# Patient Record
Sex: Male | Born: 1970 | Race: White | Hispanic: No | Marital: Married | Smoking: Never smoker
Health system: Southern US, Community
[De-identification: ages and names within clinical notes are randomized; demographics above are authoritative.]

## PROBLEM LIST (undated history)

## (undated) DIAGNOSIS — K5790 Diverticulosis of intestine, part unspecified, without perforation or abscess without bleeding: Secondary | ICD-10-CM

## (undated) DIAGNOSIS — K219 Gastro-esophageal reflux disease without esophagitis: Secondary | ICD-10-CM

## (undated) DIAGNOSIS — N200 Calculus of kidney: Secondary | ICD-10-CM

## (undated) DIAGNOSIS — F419 Anxiety disorder, unspecified: Secondary | ICD-10-CM

## (undated) HISTORY — PX: HEMORRHOID SURGERY: SHX153

## (undated) HISTORY — PX: COLON SURGERY: SHX602

---

## 2011-05-17 ENCOUNTER — Ambulatory Visit: Payer: Self-pay

## 2011-07-25 ENCOUNTER — Encounter (HOSPITAL_COMMUNITY): Payer: Self-pay | Admitting: Pharmacy Technician
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
## 2011-07-26 ENCOUNTER — Encounter (HOSPITAL_COMMUNITY): Payer: Self-pay | Admitting: *Deleted

## 2011-07-26 MED ORDER — VANCOMYCIN HCL 1000 MG IV SOLR
1500.0000 mg | INTRAVENOUS | Status: AC
  Administered 2011-07-27: 1500 mg via INTRAVENOUS
  Filled 2011-07-26: qty 1500

## 2011-07-26 MED ORDER — CHLORHEXIDINE GLUCONATE 4 % EX LIQD
60.0000 mL | Freq: Once | CUTANEOUS | Status: DC
  Filled 2011-07-26: qty 60

## 2011-07-26 MED ORDER — SODIUM CHLORIDE 0.9 % IV SOLN: INTRAVENOUS | Status: DC
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
## 2011-07-27 ENCOUNTER — Ambulatory Visit (HOSPITAL_COMMUNITY): Payer: 59 | Admitting: Anesthesiology

## 2011-07-27 ENCOUNTER — Encounter (HOSPITAL_COMMUNITY)
Admission: RE | Disposition: A | Discharge status: Home / Self Care | Payer: Self-pay | Source: Ambulatory Visit | Attending: Orthopedic Surgery

## 2011-07-27 ENCOUNTER — Ambulatory Visit (HOSPITAL_COMMUNITY): Payer: 59

## 2011-07-27 ENCOUNTER — Ambulatory Visit (HOSPITAL_COMMUNITY)
Admission: RE | Admit: 2011-07-27 | Discharge: 2011-07-28 | Disposition: A | Discharge status: Home / Self Care | Payer: 59 | Source: Ambulatory Visit | Attending: Orthopedic Surgery | Admitting: Orthopedic Surgery

## 2011-07-27 ENCOUNTER — Encounter (HOSPITAL_COMMUNITY): Payer: Self-pay | Admitting: Anesthesiology

## 2011-07-27 ENCOUNTER — Encounter (HOSPITAL_COMMUNITY): Payer: Self-pay | Admitting: *Deleted

## 2011-07-27 DIAGNOSIS — Y9389 Activity, other specified: Secondary | ICD-10-CM | POA: Insufficient documentation

## 2011-07-27 DIAGNOSIS — Y990 Civilian activity done for income or pay: Secondary | ICD-10-CM | POA: Insufficient documentation

## 2011-07-27 DIAGNOSIS — F411 Generalized anxiety disorder: Secondary | ICD-10-CM | POA: Insufficient documentation

## 2011-07-27 DIAGNOSIS — K219 Gastro-esophageal reflux disease without esophagitis: Secondary | ICD-10-CM | POA: Insufficient documentation

## 2011-07-27 DIAGNOSIS — S42009A Fracture of unspecified part of unspecified clavicle, initial encounter for closed fracture: Secondary | ICD-10-CM

## 2011-07-27 DIAGNOSIS — W1789XA Other fall from one level to another, initial encounter: Secondary | ICD-10-CM | POA: Insufficient documentation

## 2011-07-27 DIAGNOSIS — K5730 Diverticulosis of large intestine without perforation or abscess without bleeding: Secondary | ICD-10-CM | POA: Insufficient documentation

## 2011-07-27 DIAGNOSIS — S42023A Displaced fracture of shaft of unspecified clavicle, initial encounter for closed fracture: Secondary | ICD-10-CM | POA: Insufficient documentation

## 2011-07-27 DIAGNOSIS — Z87442 Personal history of urinary calculi: Secondary | ICD-10-CM | POA: Insufficient documentation

## 2011-07-27 HISTORY — PX: ORIF CLAVICULAR FRACTURE: SHX5055

## 2011-07-27 HISTORY — DX: Calculus of kidney: N20.0

## 2011-07-27 HISTORY — DX: Diverticulosis of intestine, part unspecified, without perforation or abscess without bleeding: K57.90

## 2011-07-27 HISTORY — DX: Anxiety disorder, unspecified: F41.9

## 2011-07-27 HISTORY — DX: Gastro-esophageal reflux disease without esophagitis: K21.9

## 2011-07-27 LAB — CBC
HCT: 39.5 % (ref 39.0–52.0)
MCHC: 35.7 g/dL (ref 30.0–36.0)
MCV: 85.1 fL (ref 78.0–100.0)
Platelets: 295 10*3/uL (ref 150–400)
RDW: 12.6 % (ref 11.5–15.5)

## 2011-07-27 SURGERY — OPEN REDUCTION INTERNAL FIXATION (ORIF) CLAVICULAR FRACTURE
Anesthesia: General | Site: Shoulder | Laterality: Right | Wound class: Clean

## 2011-07-27 MED ORDER — SODIUM CHLORIDE 0.9 % IV SOLN
INTRAVENOUS | Status: DC | PRN
  Administered 2011-07-27: 13:00:00 via INTRAVENOUS

## 2011-07-27 MED ORDER — ONDANSETRON HCL 4 MG/2ML IJ SOLN
INTRAMUSCULAR | Status: DC | PRN
  Administered 2011-07-27: 4 mg via INTRAVENOUS

## 2011-07-27 MED ORDER — BUPIVACAINE-EPINEPHRINE 0.25% -1:200000 IJ SOLN
INTRAMUSCULAR | Status: DC | PRN
  Administered 2011-07-27: 4 mL

## 2011-07-27 MED ORDER — ROCURONIUM BROMIDE 100 MG/10ML IV SOLN
INTRAVENOUS | Status: DC | PRN
  Administered 2011-07-27: 30 mg via INTRAVENOUS
  Administered 2011-07-27: 50 mg via INTRAVENOUS

## 2011-07-27 MED ORDER — GLYCOPYRROLATE 0.2 MG/ML IJ SOLN
INTRAMUSCULAR | Status: DC | PRN
  Administered 2011-07-27: .5 mg via INTRAVENOUS

## 2011-07-27 MED ORDER — MENTHOL 3 MG MT LOZG: 1.0000 | LOZENGE | OROMUCOSAL | Status: DC | PRN

## 2011-07-27 MED ORDER — ONDANSETRON HCL 4 MG/2ML IJ SOLN: 4.0000 mg | Freq: Four times a day (QID) | INTRAMUSCULAR | Status: DC | PRN

## 2011-07-27 MED ORDER — DEXTROSE 5 % IV SOLN
500.0000 mg | Freq: Four times a day (QID) | INTRAVENOUS | Status: DC | PRN
  Filled 2011-07-27: qty 5

## 2011-07-27 MED ORDER — 0.9 % SODIUM CHLORIDE (POUR BTL) OPTIME
TOPICAL | Status: DC | PRN
  Administered 2011-07-27: 1000 mL

## 2011-07-27 MED ORDER — DEXAMETHASONE SODIUM PHOSPHATE 4 MG/ML IJ SOLN
INTRAMUSCULAR | Status: DC | PRN
  Administered 2011-07-27 (×2): 4 mg via INTRAVENOUS

## 2011-07-27 MED ORDER — ACETAMINOPHEN 10 MG/ML IV SOLN
INTRAVENOUS | Status: AC
  Filled 2011-07-27: qty 100

## 2011-07-27 MED ORDER — ACETAMINOPHEN 650 MG RE SUPP: 650.0000 mg | Freq: Four times a day (QID) | RECTAL | Status: DC | PRN

## 2011-07-27 MED ORDER — METHOCARBAMOL 500 MG PO TABS
500.0000 mg | ORAL_TABLET | Freq: Four times a day (QID) | ORAL | Status: DC | PRN
  Administered 2011-07-27 – 2011-07-28 (×2): 500 mg via ORAL
  Filled 2011-07-27: qty 1

## 2011-07-27 MED ORDER — PANTOPRAZOLE SODIUM 40 MG PO TBEC
40.0000 mg | DELAYED_RELEASE_TABLET | Freq: Two times a day (BID) | ORAL | Status: DC
  Filled 2011-07-27 (×2): qty 1

## 2011-07-27 MED ORDER — METOCLOPRAMIDE HCL 5 MG/ML IJ SOLN: 5.0000 mg | Freq: Three times a day (TID) | INTRAMUSCULAR | Status: DC | PRN

## 2011-07-27 MED ORDER — LACTATED RINGERS IV SOLN
INTRAVENOUS | Status: DC | PRN
  Administered 2011-07-27 (×2): via INTRAVENOUS

## 2011-07-27 MED ORDER — POTASSIUM CHLORIDE IN NACL 20-0.9 MEQ/L-% IV SOLN
INTRAVENOUS | Status: DC
  Administered 2011-07-27: 18:00:00 via INTRAVENOUS
  Filled 2011-07-27 (×2): qty 1000

## 2011-07-27 MED ORDER — ESCITALOPRAM OXALATE 10 MG PO TABS
10.0000 mg | ORAL_TABLET | Freq: Every day | ORAL | Status: DC
  Administered 2011-07-28: 10 mg via ORAL
  Filled 2011-07-27: qty 1

## 2011-07-27 MED ORDER — NEOSTIGMINE METHYLSULFATE 1 MG/ML IJ SOLN
INTRAMUSCULAR | Status: DC | PRN
  Administered 2011-07-27: 4 mg via INTRAVENOUS

## 2011-07-27 MED ORDER — HYDROMORPHONE HCL PF 1 MG/ML IJ SOLN: 0.2500 mg | INTRAMUSCULAR | Status: DC | PRN

## 2011-07-27 MED ORDER — PROPOFOL 10 MG/ML IV EMUL
INTRAVENOUS | Status: DC | PRN
  Administered 2011-07-27: 180 mg via INTRAVENOUS

## 2011-07-27 MED ORDER — OXYCODONE-ACETAMINOPHEN 5-325 MG PO TABS
1.0000 | ORAL_TABLET | ORAL | Status: DC | PRN
  Administered 2011-07-28: 2 via ORAL
  Administered 2011-07-28: 1 via ORAL
  Filled 2011-07-27: qty 2
  Filled 2011-07-27: qty 1

## 2011-07-27 MED ORDER — PHENOL 1.4 % MT LIQD: 1.0000 | OROMUCOSAL | Status: DC | PRN

## 2011-07-27 MED ORDER — METOCLOPRAMIDE HCL 10 MG PO TABS: 5.0000 mg | ORAL_TABLET | Freq: Three times a day (TID) | ORAL | Status: DC | PRN

## 2011-07-27 MED ORDER — MIDAZOLAM HCL 5 MG/5ML IJ SOLN
INTRAMUSCULAR | Status: DC | PRN
  Administered 2011-07-27: 2 mg via INTRAVENOUS

## 2011-07-27 MED ORDER — FENTANYL CITRATE 0.05 MG/ML IJ SOLN
INTRAMUSCULAR | Status: DC | PRN
  Administered 2011-07-27: 100 ug via INTRAVENOUS
  Administered 2011-07-27: 50 ug via INTRAVENOUS
  Administered 2011-07-27: 100 ug via INTRAVENOUS

## 2011-07-27 MED ORDER — KETOROLAC TROMETHAMINE 30 MG/ML IJ SOLN
INTRAMUSCULAR | Status: DC | PRN
  Administered 2011-07-27: 30 mg via INTRAVENOUS

## 2011-07-27 MED ORDER — ONDANSETRON HCL 4 MG/2ML IJ SOLN: 4.0000 mg | Freq: Once | INTRAMUSCULAR | Status: DC | PRN

## 2011-07-27 MED ORDER — VANCOMYCIN HCL IN DEXTROSE 1-5 GM/200ML-% IV SOLN
1000.0000 mg | Freq: Two times a day (BID) | INTRAVENOUS | Status: AC
  Administered 2011-07-27: 1000 mg via INTRAVENOUS
  Filled 2011-07-27: qty 200

## 2011-07-27 MED ORDER — LIDOCAINE HCL (CARDIAC) 20 MG/ML IV SOLN
INTRAVENOUS | Status: DC | PRN
  Administered 2011-07-27: 50 mg via INTRAVENOUS

## 2011-07-27 MED ORDER — ACETAMINOPHEN 325 MG PO TABS: 650.0000 mg | ORAL_TABLET | Freq: Four times a day (QID) | ORAL | Status: DC | PRN

## 2011-07-27 MED ORDER — ONDANSETRON HCL 4 MG PO TABS: 4.0000 mg | ORAL_TABLET | Freq: Four times a day (QID) | ORAL | Status: DC | PRN

## 2011-07-27 MED ORDER — MUPIROCIN 2 % EX OINT
TOPICAL_OINTMENT | CUTANEOUS | Status: AC
  Administered 2011-07-27: 1 via NASAL
  Filled 2011-07-27: qty 22

## 2011-07-27 MED ORDER — HYDROMORPHONE HCL PF 1 MG/ML IJ SOLN
0.5000 mg | INTRAMUSCULAR | Status: DC | PRN
  Administered 2011-07-28: 1 mg via INTRAVENOUS
  Filled 2011-07-27: qty 1

## 2011-07-27 SURGICAL SUPPLY — 51 items
BENZOIN TINCTURE PRP APPL 2/3 (GAUZE/BANDAGES/DRESSINGS) ×2 IMPLANT
BIT DRILL 3.2 STERILE (BIT) ×1
BIT DRILL 3.2MM STRL (BIT) ×1 IMPLANT
BIT DRILL Q COUPLING 4.5 (BIT) IMPLANT
BIT DRILL Q/COUPLING 1 (BIT) IMPLANT
CLEANER TIP ELECTROSURG 2X2 (MISCELLANEOUS) ×2 IMPLANT
CLOTH BEACON ORANGE TIMEOUT ST (SAFETY) ×2 IMPLANT
CLSR STERI-STRIP ANTIMIC 1/2X4 (GAUZE/BANDAGES/DRESSINGS) ×2 IMPLANT
DRAPE C-ARM 42X72 X-RAY (DRAPES) ×2 IMPLANT
DRAPE INCISE IOBAN 66X45 STRL (DRAPES) ×2 IMPLANT
DRAPE U-SHAPE 47X51 STRL (DRAPES) ×2 IMPLANT
DRILL BIT 3.2MM STERILE (BIT) ×1
DRSG EMULSION OIL 3X3 NADH (GAUZE/BANDAGES/DRESSINGS) IMPLANT
DRSG PAD ABDOMINAL 8X10 ST (GAUZE/BANDAGES/DRESSINGS) ×2 IMPLANT
ELECT NEEDLE TIP 2.8 STRL (NEEDLE) ×2 IMPLANT
ELECT REM PT RETURN 9FT ADLT (ELECTROSURGICAL) ×2
ELECTRODE REM PT RTRN 9FT ADLT (ELECTROSURGICAL) ×1 IMPLANT
GLOVE BIO SURGEON STRL SZ8.5 (GLOVE) ×4 IMPLANT
GLOVE BIOGEL PI ORTHO PRO 7.5 (GLOVE) ×1
GLOVE BIOGEL PI ORTHO PRO SZ8 (GLOVE) ×1
GLOVE ORTHO TXT STRL SZ7.5 (GLOVE) ×2 IMPLANT
GLOVE PI ORTHO PRO STRL 7.5 (GLOVE) ×1 IMPLANT
GLOVE PI ORTHO PRO STRL SZ8 (GLOVE) ×1 IMPLANT
GLOVE SURG ORTHO 8.5 STRL (GLOVE) ×2 IMPLANT
GLOVE SURG SS PI 8.5 STRL IVOR (GLOVE) ×1
GLOVE SURG SS PI 8.5 STRL STRW (GLOVE) ×1 IMPLANT
GOWN STRL NON-REIN LRG LVL3 (GOWN DISPOSABLE) IMPLANT
GOWN STRL REIN XL XLG (GOWN DISPOSABLE) ×6 IMPLANT
KIT BASIN OR (CUSTOM PROCEDURE TRAY) ×2 IMPLANT
KIT ROOM TURNOVER OR (KITS) ×2 IMPLANT
MANIFOLD NEPTUNE II (INSTRUMENTS) ×2 IMPLANT
NEEDLE 22X1 1/2 (OR ONLY) (NEEDLE) ×2 IMPLANT
NEEDLE HYPO 25GX1X1/2 BEV (NEEDLE) ×2 IMPLANT
NS IRRIG 1000ML POUR BTL (IV SOLUTION) ×2 IMPLANT
PACK SHOULDER (CUSTOM PROCEDURE TRAY) ×2 IMPLANT
PAD ARMBOARD 7.5X6 YLW CONV (MISCELLANEOUS) ×4 IMPLANT
PIN CLAVICLE 3.8 (PIN) ×2 IMPLANT
SLING ARM FOAM STRAP LRG (SOFTGOODS) ×2 IMPLANT
SPONGE GAUZE 4X4 12PLY (GAUZE/BANDAGES/DRESSINGS) ×2 IMPLANT
SPONGE LAP 4X18 X RAY DECT (DISPOSABLE) ×2 IMPLANT
STRIP CLOSURE SKIN 1/2X4 (GAUZE/BANDAGES/DRESSINGS) ×2 IMPLANT
SUCTION FRAZIER TIP 10 FR DISP (SUCTIONS) ×2 IMPLANT
SUT MNCRL AB 4-0 PS2 18 (SUTURE) ×2 IMPLANT
SUT VIC AB 2-0 CT1 27 (SUTURE) ×2
SUT VIC AB 2-0 CT1 TAPERPNT 27 (SUTURE) ×2 IMPLANT
SUT VICRYL 0 CT 1 36IN (SUTURE) ×4 IMPLANT
SYR CONTROL 10ML LL (SYRINGE) ×2 IMPLANT
TAPE CLOTH SURG 6X10 WHT LF (GAUZE/BANDAGES/DRESSINGS) ×2 IMPLANT
TOWEL OR 17X24 6PK STRL BLUE (TOWEL DISPOSABLE) ×2 IMPLANT
TOWEL OR 17X26 10 PK STRL BLUE (TOWEL DISPOSABLE) ×2 IMPLANT
WATER STERILE IRR 1000ML POUR (IV SOLUTION) ×2 IMPLANT

## 2011-07-27 NOTE — Interval H&P Note (Signed)
History and Physical Interval Note:  07/27/2011 12:40 PM  George Miles  has presented today for surgery, with the diagnosis of right clavicle fracture  The various methods of treatment have been discussed with the patient and family. After consideration of risks, benefits and other options for treatment, the patient has consented to  Procedure(s) (LRB): OPEN REDUCTION INTERNAL FIXATION (ORIF) CLAVICULAR FRACTURE (Right) as a surgical intervention .  The patients' history has been reviewed, patient examined, no change in status, stable for surgery.  I have reviewed the patients' chart and labs.  Questions were answered to the patient's satisfaction.     Rishik Tubby,STEVEN R

## 2011-07-27 NOTE — Transfer of Care (Signed)
Immediate Anesthesia Transfer of Care Note  Patient: George Miles  Procedure(s) Performed: Procedure(s) (LRB): OPEN REDUCTION INTERNAL FIXATION (ORIF) CLAVICULAR FRACTURE (Right)  Patient Location: PACU  Anesthesia Type: General  Level of Consciousness: awake, alert  and oriented  Airway & Oxygen Therapy: Patient Spontanous Breathing and Patient connected to nasal cannula oxygen  Post-op Assessment: Report given to PACU RN and Post -op Vital signs reviewed and stable  Post vital signs: Reviewed and stable  Complications: No apparent anesthesia complications

## 2011-07-27 NOTE — Anesthesia Procedure Notes (Signed)
Procedure Name: Intubation Date/Time: 07/27/2011 1:15 PM Performed by: Carolyn Stare Pre-anesthesia Checklist: Patient identified, Emergency Drugs available, Suction available and Patient being monitored Patient Re-evaluated:Patient Re-evaluated prior to inductionOxygen Delivery Method: Circle system utilized Preoxygenation: Pre-oxygenation with 100% oxygen Intubation Type: IV induction Ventilation: Mask ventilation without difficulty Comments: Used Glidescope without difficulty - 8.0ETT @ 22cm

## 2011-07-27 NOTE — Preoperative (Signed)
Beta Blockers   Reason not to administer Beta Blockers:Not Applicable 

## 2011-07-27 NOTE — Anesthesia Preprocedure Evaluation (Addendum)
Anesthesia Evaluation  Patient identified by MRN, date of birth, ID band Patient awake    Reviewed: Allergy & Precautions, H&P , NPO status , Patient's Chart, lab work & pertinent test results, reviewed documented beta blocker date and time   Airway  TM Distance: >3 FB     Dental  (+) Teeth Intact and Dental Advisory Given   Pulmonary neg pulmonary ROS,  breath sounds clear to auscultation  Pulmonary exam normal       Cardiovascular negative cardio ROS  Rhythm:Regular Rate:Normal     Neuro/Psych Anxiety negative neurological ROS     GI/Hepatic Neg liver ROS,   Endo/Other  negative endocrine ROS  Renal/GU negative Renal ROS  negative genitourinary   Musculoskeletal negative musculoskeletal ROS (+)   Abdominal Normal abdominal exam  (+)   Peds  Hematology negative hematology ROS (+)   Anesthesia Other Findings   Reproductive/Obstetrics                         Anesthesia Physical Anesthesia Plan  ASA: II  Anesthesia Plan: General   Post-op Pain Management: MAC Combined w/ Regional for Post-op pain   Induction: Intravenous  Airway Management Planned: Oral ETT  Additional Equipment:   Intra-op Plan:   Post-operative Plan: Extubation in OR  Informed Consent: I have reviewed the patients History and Physical, chart, labs and discussed the procedure including the risks, benefits and alternatives for the proposed anesthesia with the patient or authorized representative who has indicated his/her understanding and acceptance.   Dental advisory given  Plan Discussed with: CRNA, Surgeon and Anesthesiologist  Anesthesia Plan Comments: (GERD  ? Syncopal episode w/u (-)  Kipp Brood MD)       Anesthesia Quick Evaluation

## 2011-07-27 NOTE — Discharge Instructions (Signed)
Ice to the shoulder at all times.   Do not lift at all.  Keep arm in sling or propped on pillow. Sleep upright in a recliner.  Follow up in two weeks with Dr Ranell Patrick, [phone]  Patient has pain medications at home.

## 2011-07-27 NOTE — Transfer of Care (Deleted)
Immediate Anesthesia Transfer of Care Note  Patient: George Miles  Procedure(s) Performed: Procedure(s) (LRB): OPEN REDUCTION INTERNAL FIXATION (ORIF) CLAVICULAR FRACTURE (Right)  Patient Location: PACU  Anesthesia Type: General  Level of Consciousness: awake, alert  and oriented  Airway & Oxygen Therapy: Patient Spontanous Breathing and Patient connected to nasal cannula oxygen  Post-op Assessment: Report given to PACU RN and Post -op Vital signs reviewed and stable  Post vital signs: Reviewed and stable  Complications: No apparent anesthesia complications 

## 2011-07-27 NOTE — Brief Op Note (Signed)
07/27/2011  3:03 PM  PATIENT:  George Miles  41 y.o. male  PRE-OPERATIVE DIAGNOSIS:  right clavicle fracture, displaced, comminuted, shortened  POST-OPERATIVE DIAGNOSIS:  right clavicle fracture, displaced, comminuted, shortened  PROCEDURE:  Procedure(s) (LRB): OPEN REDUCTION INTERNAL FIXATION (ORIF) CLAVICULAR FRACTURE (Right), De Puy Clavicle pin  SURGEON:  Surgeon(s) and Role:    * Verlee Rossetti, MD - Primary  PHYSICIAN ASSISTANT:   ASSISTANTS: Thea Gist, PA-C   ANESTHESIA:   general  EBL:  Total I/O In: 1500 [I.V.:1500] Out: 150 [Blood:150]  BLOOD ADMINISTERED:none  DRAINS: none   LOCAL MEDICATIONS USED:  MARCAINE     SPECIMEN:  No Specimen  DISPOSITION OF SPECIMEN:  N/A  COUNTS:  YES  TOURNIQUET:  * No tourniquets in log *  DICTATION: .Other Dictation: Dictation Number [id number]  PLAN OF CARE: Admit for overnight observation  PATIENT DISPOSITION:  PACU - hemodynamically stable.

## 2011-07-28 MED ORDER — MUPIROCIN 2 % EX OINT
1.0000 "application " | TOPICAL_OINTMENT | Freq: Two times a day (BID) | CUTANEOUS | Status: DC
  Administered 2011-07-28 (×2): 1 via NASAL
  Filled 2011-07-28: qty 22

## 2011-07-28 NOTE — Anesthesia Postprocedure Evaluation (Signed)
  Anesthesia Post-op Note  Patient: George Miles  Procedure(s) Performed: Procedure(s) (LRB): OPEN REDUCTION INTERNAL FIXATION (ORIF) CLAVICULAR FRACTURE (Right)  Patient Location: PACU  Anesthesia Type: General  Level of Consciousness: awake, alert  and oriented  Airway and Oxygen Therapy: Patient Spontanous Breathing  Post-op Pain: none  Post-op Assessment: Post-op Vital signs reviewed and Patient's Cardiovascular Status Stable  Post-op Vital Signs: stable  Complications: No apparent anesthesia complications

## 2011-07-28 NOTE — Discharge Summary (Signed)
Physician Discharge Summary  Patient ID: George Miles MRN: [id number] DOB/AGE: 09/15/1970 40 y.o.  Admit date: 07/27/2011 Discharge date: 07/28/2011  Admission Diagnoses:  Right clavicle fracture with shortening and displacement  Discharge Diagnoses:  Same   Surgeries: Procedure(s): OPEN REDUCTION INTERNAL FIXATION (ORIF) CLAVICULAR FRACTURE on 07/27/2011   Consultants: pt/ot  Discharged Condition: Stable  Hospital Course: George Miles is an 41 y.o. male who was admitted 07/27/2011 with a chief complaint of No chief complaint on file. , and found to have a diagnosis of <principal problem not specified>.  They were brought to the operating room on 07/27/2011 and underwent the above named procedures.    The patient had an uncomplicated hospital course and was stable for discharge.  Recent vital signs:  Filed Vitals:   07/28/11 0604  BP: 136/80  Pulse: 91  Temp: 98.8 F (37.1 C)  Resp: 18    Recent laboratory studies:  Results for orders placed during the hospital encounter of 07/27/11  CBC      Component Value Range   WBC 8.9  4.0 - 10.5 (K/uL)   RBC 4.64  4.22 - 5.81 (MIL/uL)   Hemoglobin 14.1  13.0 - 17.0 (g/dL)   HCT 40.9  81.1 - 91.4 (%)   MCV 85.1  78.0 - 100.0 (fL)   MCH 30.4  26.0 - 34.0 (pg)   MCHC 35.7  30.0 - 36.0 (g/dL)   RDW 78.2  95.6 - 21.3 (%)   Platelets 295  150 - 400 (K/uL)  SURGICAL PCR SCREEN      Component Value Range   MRSA, PCR NEGATIVE  NEGATIVE    Staphylococcus aureus POSITIVE (*) NEGATIVE     Discharge Medications:   Medication List  As of 07/28/2011  7:37 AM   TAKE these medications         escitalopram 10 MG tablet   Commonly known as: LEXAPRO   Take 10 mg by mouth daily.      oxyCODONE 10 MG 12 hr tablet   Commonly known as: OXYCONTIN   Take 10 mg by mouth every 12 (twelve) hours.      pantoprazole 40 MG tablet   Commonly known as: PROTONIX   Take 40-80 mg by mouth daily as needed. For acid reflux            Diagnostic  Studies: Dg Clavicle Right  07/27/2011  *RADIOLOGY REPORT*  Clinical Data: ORIF of right clavicle.  RIGHT CLAVICLE - 2+ VIEWS  Comparison: None.  Findings: 2 intraoperative views.  Internal fixation of a mid clavicular shaft fracture.  Persistent one cortical width of inferior displacement of distal fracture fragment. No acute hardware complication.  IMPRESSION: Intraoperative imaging of mid clavicular shaft fracture fixation.  Original Report Authenticated By: Consuello Bossier, M.D.   Dg Shoulder Right  07/27/2011  *RADIOLOGY REPORT*  Clinical Data: Right clavicle fracture.  RIGHT SHOULDER - 2+ VIEW  Comparison: 07/27/2011  Findings: A single long internal fixation screw is seen transfixing the distal clavicle fracture in near anatomic alignment.  The [facility] joint remains intact.  IMPRESSION: Internal fixation of distal clavicle fracture in near anatomic alignment.  Original Report Authenticated By: Danae Orleans, M.D.    Disposition: Final discharge disposition not confirmed  Discharge Orders    Future Orders Please Complete By Expires   Diet - low sodium heart healthy      Call MD / Call 911      Comments:   If you experience chest pain  or shortness of breath, CALL 911 and be transported to the hospital emergency room.  If you develope a fever above 101 F, pus (white drainage) or increased drainage or redness at the wound, or calf pain, call your surgeon's office.   Constipation Prevention      Comments:   Drink plenty of fluids.  Prune juice may be helpful.  You may use a stool softener, such as Colace (over the counter) 100 mg twice a day.  Use MiraLax (over the counter) for constipation as needed.   Increase activity slowly as tolerated      Weight Bearing as taught in Physical Therapy      Comments:   Use a walker or crutches as instructed.      Follow-up Information    Follow up with NORRIS,STEVEN R, MD in 2 weeks.   Contact information:   [facility] [address], [address] [place] [address] [phone]           Signed: Thea Gist 07/28/2011, 7:37 AM

## 2011-07-28 NOTE — Progress Notes (Signed)
Orthopedics Progress Note  Subjective: Pt c/o mild pain to right shoulder this am s/p clavicle orif. Denies any other symptoms  Objective:  Filed Vitals:   07/28/11 0604  BP: 136/80  Pulse: 91  Temp: 98.8 F (37.1 C)  Resp: 18    General: Awake and alert  Musculoskeletal: right shoulder dressing intact, nv intact distally, sling in place Neurovascularly intact  Lab Results  Component Value Date   WBC 8.9 07/27/2011   HGB 14.1 07/27/2011   HCT 39.5 07/27/2011   MCV 85.1 07/27/2011   PLT 295 07/27/2011    No results found for this basename: na, k, cl, co2, glucose, bun, creatinine, calcium, gfrnonaa, gfraa    No results found for this basename: INR, PROTIME    Assessment/Plan: POD #1 s/p Procedure(s): OPEN REDUCTION INTERNAL FIXATION (ORIF) CLAVICULAR FRACTURE  D/c home today Pain control Encourage incentive spirometer  [employer]. Ranell Patrick, MD 07/28/2011 7:34 AM

## 2011-07-28 NOTE — Progress Notes (Signed)
Utilization review completed. Elfreida Heggs, RN, BSN.  07/28/11  

## 2011-07-28 NOTE — Progress Notes (Signed)
Orthopedic Tech Progress Note Patient Details:  George Miles Sep 12, 1970 [id number]  Other Ortho Devices Ortho Device Location: shoullder immobilizer Ortho Device Interventions: Application   Cammer, Mickie Bail 07/28/2011, 1:02 PM
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
## 2011-07-28 NOTE — Progress Notes (Signed)
OT Evaluation Note   Orders received for OT consult one time shoulder evaluation completed and placed in health history and assessment section of the hard chart.  Pt and wife educated on AROM exercises for the elbow, wrist and digits only.  Also educated on sling wear, positioning, and ADLs.  Feel pt needs another sling with waist strap as he reports trying to move his upper arm at night when he's sleeping.  No further acute OT needs.  Perrin Maltese, OTR/L Pager number [phone] 07/28/2011

## 2011-07-28 NOTE — Progress Notes (Signed)
Utilization review completed. Melissia Lahman, RN, BSN.  07/28/11  

## 2011-08-01 ENCOUNTER — Encounter (HOSPITAL_COMMUNITY): Payer: Self-pay | Admitting: Orthopedic Surgery

## 2012-01-10 ENCOUNTER — Encounter (HOSPITAL_COMMUNITY): Payer: Self-pay

## 2012-01-10 ENCOUNTER — Encounter (HOSPITAL_COMMUNITY)
Admission: RE | Admit: 2012-01-10 | Discharge: 2012-01-10 | Disposition: A | Discharge status: Home / Self Care | Payer: 59 | Source: Ambulatory Visit | Attending: Orthopedic Surgery | Admitting: Orthopedic Surgery

## 2012-01-10 LAB — CBC
HCT: 42.8 % (ref 39.0–52.0)
MCHC: 35 g/dL (ref 30.0–36.0)
MCV: 88.2 fL (ref 78.0–100.0)
RDW: 13.4 % (ref 11.5–15.5)

## 2012-01-10 NOTE — Progress Notes (Signed)
Pt reports had stress test, possibly at [facility] - [phone]. Requested.

## 2012-01-10 NOTE — Pre-Procedure Instructions (Signed)
20 George Miles  01/10/2012   Your procedure is scheduled on:  September 6th  Report to [facility] at 0830 AM.  Call this number if you have problems the morning of surgery: [phone]   Remember:   Do not eat food or drink:After Midnight.  Take these medicines the morning of surgery with A SIP OF WATER: protonix and lexapro   Do not wear jewelry, make-up or nail polish.  Do not wear lotions, powders, or perfumes.  Do not shave 48 hours prior to surgery. Men may shave face and neck.  Do not bring valuables to the hospital.  Contacts, dentures or bridgework may not be worn into surgery.  Leave suitcase in the car. After surgery it may be brought to your room.  For patients admitted to the hospital, checkout time is 11:00 AM the day of discharge.   Patients discharged the day of surgery will not be allowed to drive home.   Special Instructions: CHG Shower Use Special Wash: 1/2 bottle night before surgery and 1/2 bottle morning of surgery.   Please read over the following fact sheets that you were given: Pain Booklet, Coughing and Deep Breathing, MRSA Information and Surgical Site Infection Prevention

## 2012-01-16 NOTE — H&P (Signed)
CC: right shoulder stiffness s/p clavicle pin for fracture HPI: 41 y/o male injured right shoulder requiring a clavicle pin for fracture. 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 Pt presents for capsular release and pin removal PMH: GERD, anxiety, diverticulitis Social: non smoker, no ETOH, no illicit drug use Allergies: penicillin, tape Meds: lexapro, protonix ROS: pain and stiffness to right shoulder with movement s/p surgery for clavicle fracture Otherwise ros negative No numbness or tingling distally PE: alert and appropriate 41 y/o male in no acute distress  Cervical spine full rom cranial nerves 2-12 intact Chest: active breath sounds with no wheeze or rhonchi Heart: regular rate and rhythm no murmur Right shoulder: moderate stiffness with rom, nv intact distally Assessment: right shoulder stiffness with clavicle pain for fracture Plan: capsular release with clavicle pin removal

## 2012-01-18 MED ORDER — SODIUM CHLORIDE 0.9 % IV SOLN
1500.0000 mg | INTRAVENOUS | Status: AC
  Administered 2012-01-19: 1500 mg via INTRAVENOUS
  Filled 2012-01-18: qty 1500

## 2012-01-19 ENCOUNTER — Ambulatory Visit (HOSPITAL_COMMUNITY)
Admission: RE | Admit: 2012-01-19 | Discharge: 2012-01-19 | Disposition: A | Discharge status: Home / Self Care | Payer: 59 | Source: Ambulatory Visit | Attending: Orthopedic Surgery | Admitting: Orthopedic Surgery

## 2012-01-19 ENCOUNTER — Encounter (HOSPITAL_COMMUNITY): Payer: Self-pay | Admitting: *Deleted

## 2012-01-19 ENCOUNTER — Encounter (HOSPITAL_COMMUNITY)
Admission: RE | Disposition: A | Discharge status: Home / Self Care | Payer: Self-pay | Source: Ambulatory Visit | Attending: Orthopedic Surgery

## 2012-01-19 ENCOUNTER — Ambulatory Visit (HOSPITAL_COMMUNITY): Payer: 59 | Admitting: *Deleted

## 2012-01-19 DIAGNOSIS — K219 Gastro-esophageal reflux disease without esophagitis: Secondary | ICD-10-CM | POA: Insufficient documentation

## 2012-01-19 DIAGNOSIS — F411 Generalized anxiety disorder: Secondary | ICD-10-CM | POA: Insufficient documentation

## 2012-01-19 DIAGNOSIS — T8489XA Other specified complication of internal orthopedic prosthetic devices, implants and grafts, initial encounter: Secondary | ICD-10-CM | POA: Insufficient documentation

## 2012-01-19 DIAGNOSIS — Y831 Surgical operation with implant of artificial internal device as the cause of abnormal reaction of the patient, or of later complication, without mention of misadventure at the time of the procedure: Secondary | ICD-10-CM | POA: Insufficient documentation

## 2012-01-19 DIAGNOSIS — M67919 Unspecified disorder of synovium and tendon, unspecified shoulder: Secondary | ICD-10-CM | POA: Insufficient documentation

## 2012-01-19 DIAGNOSIS — M7500 Adhesive capsulitis of unspecified shoulder: Secondary | ICD-10-CM | POA: Insufficient documentation

## 2012-01-19 DIAGNOSIS — Z01812 Encounter for preprocedural laboratory examination: Secondary | ICD-10-CM | POA: Insufficient documentation

## 2012-01-19 DIAGNOSIS — M719 Bursopathy, unspecified: Secondary | ICD-10-CM | POA: Insufficient documentation

## 2012-01-19 HISTORY — PX: SHOULDER ARTHROSCOPY: SHX128

## 2012-01-19 SURGERY — ARTHROSCOPY, SHOULDER
Anesthesia: General | Site: Shoulder | Laterality: Right | Wound class: Clean

## 2012-01-19 MED ORDER — FENTANYL CITRATE 0.05 MG/ML IJ SOLN
50.0000 ug | INTRAMUSCULAR | Status: DC | PRN
  Administered 2012-01-19: 100 ug via INTRAVENOUS

## 2012-01-19 MED ORDER — METHOCARBAMOL 500 MG PO TABS: 500.0000 mg | ORAL_TABLET | Freq: Three times a day (TID) | ORAL | Status: AC | PRN

## 2012-01-19 MED ORDER — ONDANSETRON HCL 4 MG/2ML IJ SOLN: 4.0000 mg | Freq: Once | INTRAMUSCULAR | Status: DC | PRN

## 2012-01-19 MED ORDER — BUPIVACAINE-EPINEPHRINE PF 0.5-1:200000 % IJ SOLN
INTRAMUSCULAR | Status: DC | PRN
  Administered 2012-01-19: 30 mL

## 2012-01-19 MED ORDER — METHOCARBAMOL 500 MG PO TABS
ORAL_TABLET | ORAL | Status: AC
  Filled 2012-01-19: qty 1

## 2012-01-19 MED ORDER — ONDANSETRON HCL 4 MG/2ML IJ SOLN
INTRAMUSCULAR | Status: DC | PRN
  Administered 2012-01-19: 4 mg via INTRAVENOUS

## 2012-01-19 MED ORDER — LACTATED RINGERS IV SOLN
INTRAVENOUS | Status: DC
  Administered 2012-01-19: 10:00:00 via INTRAVENOUS

## 2012-01-19 MED ORDER — SODIUM CHLORIDE 0.9 % IV SOLN
INTRAVENOUS | Status: DC | PRN
  Administered 2012-01-19: 10:00:00 via INTRAVENOUS

## 2012-01-19 MED ORDER — SUCCINYLCHOLINE CHLORIDE 20 MG/ML IJ SOLN
INTRAMUSCULAR | Status: DC | PRN
  Administered 2012-01-19: 160 mg via INTRAVENOUS

## 2012-01-19 MED ORDER — OXYCODONE-ACETAMINOPHEN 5-325 MG PO TABS
ORAL_TABLET | ORAL | Status: AC
  Filled 2012-01-19: qty 2

## 2012-01-19 MED ORDER — HYDROMORPHONE HCL PF 1 MG/ML IJ SOLN: 0.2500 mg | INTRAMUSCULAR | Status: DC | PRN

## 2012-01-19 MED ORDER — MIDAZOLAM HCL 2 MG/2ML IJ SOLN
1.0000 mg | INTRAMUSCULAR | Status: DC | PRN
  Administered 2012-01-19: 2 mg via INTRAVENOUS

## 2012-01-19 MED ORDER — CHLORHEXIDINE GLUCONATE 4 % EX LIQD: 60.0000 mL | Freq: Once | CUTANEOUS | Status: DC

## 2012-01-19 MED ORDER — FENTANYL CITRATE 0.05 MG/ML IJ SOLN
INTRAMUSCULAR | Status: AC
  Filled 2012-01-19: qty 2

## 2012-01-19 MED ORDER — SODIUM CHLORIDE 0.9 % IV SOLN
10.0000 mg | INTRAVENOUS | Status: DC | PRN
  Administered 2012-01-19: 10 ug/min via INTRAVENOUS

## 2012-01-19 MED ORDER — PROPOFOL 10 MG/ML IV EMUL
INTRAVENOUS | Status: DC | PRN
  Administered 2012-01-19: 200 mg via INTRAVENOUS

## 2012-01-19 MED ORDER — BUPIVACAINE-EPINEPHRINE 0.25% -1:200000 IJ SOLN
INTRAMUSCULAR | Status: DC | PRN
  Administered 2012-01-19: 8 mL

## 2012-01-19 MED ORDER — BUPIVACAINE-EPINEPHRINE PF 0.25-1:200000 % IJ SOLN
INTRAMUSCULAR | Status: AC
Start: 2012-01-19 — End: 2012-01-19
  Filled 2012-01-19: qty 30

## 2012-01-19 MED ORDER — OXYCODONE-ACETAMINOPHEN 5-325 MG PO TABS: 1.0000 | ORAL_TABLET | ORAL | Status: AC | PRN

## 2012-01-19 MED ORDER — MIDAZOLAM HCL 2 MG/2ML IJ SOLN
INTRAMUSCULAR | Status: AC
  Filled 2012-01-19: qty 2

## 2012-01-19 MED ORDER — FENTANYL CITRATE 0.05 MG/ML IJ SOLN
INTRAMUSCULAR | Status: DC | PRN
  Administered 2012-01-19: 100 ug via INTRAVENOUS

## 2012-01-19 MED ORDER — METHOCARBAMOL 500 MG PO TABS
500.0000 mg | ORAL_TABLET | Freq: Once | ORAL | Status: AC
  Administered 2012-01-19: 500 mg via ORAL

## 2012-01-19 MED ORDER — GLYCOPYRROLATE 0.2 MG/ML IJ SOLN
INTRAMUSCULAR | Status: DC | PRN
  Administered 2012-01-19: .7 mg via INTRAVENOUS

## 2012-01-19 MED ORDER — ROCURONIUM BROMIDE 100 MG/10ML IV SOLN
INTRAVENOUS | Status: DC | PRN
  Administered 2012-01-19: 25 mg via INTRAVENOUS

## 2012-01-19 MED ORDER — OXYCODONE-ACETAMINOPHEN 5-325 MG PO TABS
1.0000 | ORAL_TABLET | ORAL | Status: DC | PRN
  Administered 2012-01-19: 2 via ORAL

## 2012-01-19 MED ORDER — LACTATED RINGERS IV SOLN
INTRAVENOUS | Status: DC | PRN
  Administered 2012-01-19 (×2): via INTRAVENOUS

## 2012-01-19 MED ORDER — LIDOCAINE HCL (CARDIAC) 20 MG/ML IV SOLN
INTRAVENOUS | Status: DC | PRN
  Administered 2012-01-19: 100 mg via INTRAVENOUS

## 2012-01-19 MED ORDER — NEOSTIGMINE METHYLSULFATE 1 MG/ML IJ SOLN
INTRAMUSCULAR | Status: DC | PRN
  Administered 2012-01-19: 4 mg via INTRAVENOUS

## 2012-01-19 MED ORDER — SODIUM CHLORIDE 0.9 % IR SOLN
Status: DC | PRN
  Administered 2012-01-19: 6000 mL

## 2012-01-19 SURGICAL SUPPLY — 45 items
BLADE CUDA 4.2 (BLADE) IMPLANT
BLADE SURG 11 STRL SS (BLADE) ×2 IMPLANT
BUR OVAL 4.0 (BURR) IMPLANT
CLOTH BEACON ORANGE TIMEOUT ST (SAFETY) ×2 IMPLANT
COVER SURGICAL LIGHT HANDLE (MISCELLANEOUS) ×2 IMPLANT
DRAPE INCISE IOBAN 66X45 STRL (DRAPES) IMPLANT
DRAPE STERI 35X30 U-POUCH (DRAPES) ×2 IMPLANT
DRAPE U-SHAPE 47X51 STRL (DRAPES) ×2 IMPLANT
DRSG EMULSION OIL 3X3 NADH (GAUZE/BANDAGES/DRESSINGS) ×4 IMPLANT
DRSG PAD ABDOMINAL 8X10 ST (GAUZE/BANDAGES/DRESSINGS) ×4 IMPLANT
DURAPREP 26ML APPLICATOR (WOUND CARE) ×2 IMPLANT
ELECT REM PT RETURN 9FT ADLT (ELECTROSURGICAL)
ELECTRODE REM PT RTRN 9FT ADLT (ELECTROSURGICAL) IMPLANT
GLOVE BIOGEL PI ORTHO PRO 7.5 (GLOVE) ×1
GLOVE BIOGEL PI ORTHO PRO SZ8 (GLOVE) ×1
GLOVE ORTHO TXT STRL SZ7.5 (GLOVE) ×2 IMPLANT
GLOVE PI ORTHO PRO STRL 7.5 (GLOVE) ×1 IMPLANT
GLOVE PI ORTHO PRO STRL SZ8 (GLOVE) ×1 IMPLANT
GLOVE SURG ORTHO 8.5 STRL (GLOVE) ×2 IMPLANT
GOWN STRL REIN XL XLG (GOWN DISPOSABLE) ×8 IMPLANT
KIT BASIN OR (CUSTOM PROCEDURE TRAY) ×2 IMPLANT
KIT ROOM TURNOVER OR (KITS) ×2 IMPLANT
MANIFOLD NEPTUNE II (INSTRUMENTS) ×2 IMPLANT
NEEDLE HYPO 25GX1X1/2 BEV (NEEDLE) ×2 IMPLANT
NEEDLE SPNL 18GX3.5 QUINCKE PK (NEEDLE) ×2 IMPLANT
NS IRRIG 1000ML POUR BTL (IV SOLUTION) ×2 IMPLANT
PACK SHOULDER (CUSTOM PROCEDURE TRAY) ×2 IMPLANT
PAD ARMBOARD 7.5X6 YLW CONV (MISCELLANEOUS) ×4 IMPLANT
RESECTOR FULL RADIUS 4.2MM (BLADE) ×2 IMPLANT
SET ARTHROSCOPY TUBING (MISCELLANEOUS) ×1
SET ARTHROSCOPY TUBING LN (MISCELLANEOUS) ×1 IMPLANT
SLING ARM FOAM STRAP LRG (SOFTGOODS) ×2 IMPLANT
SLING ARM FOAM STRAP MED (SOFTGOODS) IMPLANT
SPONGE GAUZE 4X4 12PLY (GAUZE/BANDAGES/DRESSINGS) ×2 IMPLANT
STRIP CLOSURE SKIN 1/2X4 (GAUZE/BANDAGES/DRESSINGS) ×2 IMPLANT
SUT VIC AB 0 CT2 27 (SUTURE) IMPLANT
SUT VIC AB 2-0 CT1 27 (SUTURE)
SUT VIC AB 2-0 CT1 TAPERPNT 27 (SUTURE) IMPLANT
SUT VICRYL 0 CT 1 36IN (SUTURE) IMPLANT
SYR CONTROL 10ML LL (SYRINGE) ×2 IMPLANT
TOWEL OR 17X24 6PK STRL BLUE (TOWEL DISPOSABLE) ×2 IMPLANT
TOWEL OR 17X26 10 PK STRL BLUE (TOWEL DISPOSABLE) ×2 IMPLANT
TUBE CONNECTING 12X1/4 (SUCTIONS) IMPLANT
WAND 90 DEG TURBOVAC W/CORD (SURGICAL WAND) IMPLANT
WATER STERILE IRR 1000ML POUR (IV SOLUTION) ×2 IMPLANT

## 2012-01-19 NOTE — Progress Notes (Signed)
Report to Arlana Pouch RN as primary caregiver

## 2012-01-19 NOTE — Preoperative (Signed)
Beta Blockers   Reason not to administer Beta Blockers:Not Applicable 

## 2012-01-19 NOTE — Brief Op Note (Signed)
01/19/2012  11:29 AM  PATIENT:  George Miles  41 y.o. male  PRE-OPERATIVE DIAGNOSIS:  RIGHT SHOULDER RETAINED HARDWARE/FROZEN SHOULDER  POST-OPERATIVE DIAGNOSIS:  right shoulder retained hardware/frozen  PROCEDURE:  Procedure(s) (LRB) with comments: ARTHROSCOPY SHOULDER (Right) - RIGHT SHOULDER ARTHROSCOPY WITH HARDWARE REMOVAL, CLAVICLE PIN/CAPSULAR RELEASE, Extensive debridement  SURGEON:  Surgeon(s) and Role:    * Verlee Rossetti, MD - Primary  PHYSICIAN ASSISTANT:   ASSISTANTS: Thea Gist, PA-C   ANESTHESIA:   regional and general  EBL:  Total I/O In: 500 [I.V.:500] Out: -   BLOOD ADMINISTERED:none  DRAINS: none   LOCAL MEDICATIONS USED:  MARCAINE     SPECIMEN:  No Specimen  DISPOSITION OF SPECIMEN:  N/A  COUNTS:  YES  TOURNIQUET:  * No tourniquets in log *  DICTATION: .Other Dictation: Dictation Number [id number]  PLAN OF CARE: Discharge to home after PACU  PATIENT DISPOSITION:  PACU - hemodynamically stable.   Delay start of Pharmacological VTE agent (>24hrs) due to surgical blood loss or risk of bleeding: not applicable

## 2012-01-19 NOTE — Interval H&P Note (Signed)
History and Physical Interval Note:  01/19/2012 9:34 AM  George Miles  has presented today for surgery, with the diagnosis of RIGHT SHOULDER RETAINED HARDWARE/FROZEN SHOULDER  The various methods of treatment have been discussed with the patient and family. After consideration of risks, benefits and other options for treatment, the patient has consented to  Procedure(s) (LRB) with comments: ARTHROSCOPY SHOULDER (Right) - RIGHT SHOULDER ARTHROSCOPY WITH HARDWARE REMOVAL, CLAVICLE PIN/CAPSULAR RELEASE as a surgical intervention .  The patient's history has been reviewed, patient examined, no change in status, stable for surgery.  I have reviewed the patient's chart and labs.  Questions were answered to the patient's satisfaction.     Bethzaida Boord,STEVEN R

## 2012-01-19 NOTE — Anesthesia Procedure Notes (Signed)
Anesthesia Regional Block:  Interscalene brachial plexus block  Pre-Anesthetic Checklist: ,, timeout performed, Correct Patient, Correct Site, Correct Laterality, Correct Procedure, Correct Position, site marked, Risks and benefits discussed,  Surgical consent,  Pre-op evaluation,  At surgeon's request and post-op pain management  Laterality: Right and Upper  Prep: chloraprep       Needles:  Injection technique: Single-shot  Needle Type: Echogenic Needle     Needle Length: 5cm 5 cm Needle Gauge: 21    Additional Needles:  Procedures: ultrasound guided Interscalene brachial plexus block Narrative:  Start time: 01/19/2012 10:02 AM End time: 01/19/2012 10:12 AM Injection made incrementally with aspirations every 5 mL.  Performed by: Personally  Anesthesiologist: Sheldon Silvan  Additional Notes: Marcaine 0.5% with 1:200000 Epi, 30 ml  Supraclavicular block

## 2012-01-19 NOTE — Anesthesia Preprocedure Evaluation (Addendum)
Anesthesia Evaluation  Patient identified by MRN, date of birth, ID band Patient awake    Reviewed: Allergy & Precautions, H&P , NPO status , Patient's Chart, lab work & pertinent test results  History of Anesthesia Complications Negative for: history of anesthetic complications  Airway Mallampati: II TM Distance: >3 FB Neck ROM: Full    Dental No notable dental hx. (+) Dental Advisory Given   Pulmonary  breath sounds clear to auscultation        Cardiovascular negative cardio ROS  Rhythm:Regular Rate:Normal     Neuro/Psych Anxiety    GI/Hepatic Neg liver ROS, GERD-  Controlled,  Endo/Other  negative endocrine ROSMorbid obesity  Renal/GU   negative genitourinary   Musculoskeletal   Abdominal   Peds  Hematology negative hematology ROS (+)   Anesthesia Other Findings   Reproductive/Obstetrics                          Anesthesia Physical Anesthesia Plan  ASA: II  Anesthesia Plan: General   Post-op Pain Management:    Induction: Intravenous  Airway Management Planned: Oral ETT and Video Laryngoscope Planned  Additional Equipment:   Intra-op Plan:   Post-operative Plan: Extubation in OR  Informed Consent: I have reviewed the patients History and Physical, chart, labs and discussed the procedure including the risks, benefits and alternatives for the proposed anesthesia with the patient or authorized representative who has indicated his/her understanding and acceptance.   Dental advisory given  Plan Discussed with: CRNA, Anesthesiologist and Surgeon  Anesthesia Plan Comments:        Anesthesia Quick Evaluation

## 2012-01-19 NOTE — Anesthesia Postprocedure Evaluation (Signed)
  Anesthesia Post-op Note  Patient: George Miles  Procedure(s) Performed: Procedure(s) (LRB) with comments: ARTHROSCOPY SHOULDER (Right) - RIGHT SHOULDER ARTHROSCOPY WITH HARDWARE REMOVAL, CLAVICLE PIN/CAPSULAR RELEASE  Patient Location: PACU  Anesthesia Type: GA combined with regional for post-op pain  Level of Consciousness: awake, alert  and oriented  Airway and Oxygen Therapy: Patient Spontanous Breathing and Patient connected to nasal cannula oxygen  Post-op Pain: none  Post-op Assessment: Post-op Vital signs reviewed  Post-op Vital Signs: Reviewed  Complications: No apparent anesthesia complications

## 2012-01-19 NOTE — Progress Notes (Signed)
Care of pt assumed by [facility].  Pt was adm to PACU by Bethena Midget RN

## 2012-01-19 NOTE — Transfer of Care (Signed)
Immediate Anesthesia Transfer of Care Note  Patient: George Miles  Procedure(s) Performed: Procedure(s) (LRB) with comments: ARTHROSCOPY SHOULDER (Right) - RIGHT SHOULDER ARTHROSCOPY WITH HARDWARE REMOVAL, CLAVICLE PIN/CAPSULAR RELEASE  Patient Location: PACU  Anesthesia Type: General  Level of Consciousness: awake and alert   Airway & Oxygen Therapy: Patient Spontanous Breathing and Patient connected to face mask oxygen  Post-op Assessment: Report given to PACU RN and Post -op Vital signs reviewed and stable  Post vital signs: Reviewed and stable  Complications: No apparent anesthesia complications

## 2012-01-20 NOTE — Op Note (Signed)
[name], [name]                 ACCOUNT NO.:  [id number]  MEDICAL RECORD NO.:  [id number]  LOCATION:  [facility]                         FACILITY:  [facility]  PHYSICIAN:  Almedia Balls. Ranell Patrick, M.D. DATE OF BIRTH:  06-07-1970  DATE OF PROCEDURE:  01/19/2012 DATE OF DISCHARGE:  01/19/2012                              OPERATIVE REPORT   PREOPERATIVE DIAGNOSIS:  Right shoulder stiffness and pain with retained painful hardware following open reduction and internal fixation clavicle pin after severe shoulder injury.  POSTOPERATIVE DIAGNOSIS:  Right shoulder stiffness and pain with retained painful hardware following open reduction and internal fixation clavicle pin after severe shoulder injury.  PROCEDURE PERFORMED:  Right shoulder exam under anesthesia, attempted manipulation under anesthesia followed by shoulder arthroscopy, extensive intra-articular debridement, and arthroscopic capsular release, arthroscopic subacromial bursectomy with no acromioplasty followed by open clavicle pin removal/implant removal deep.  ATTENDING SURGEON:  Almedia Balls. Ranell Patrick, M.D.  ASSISTANT:  Donnie Coffin. Dixon, PA-C was scrubbed for the entire procedure and necessary for satisfactory completion of surgery.  ANESTHESIA:  General anesthesia plus interscalene block anesthesia was used.  ESTIMATED BLOOD LOSS:  Minimal.  FLUID REPLACEMENT:  1500 mL of crystalloid.  INSTRUMENT COUNTS:  Correct.  COMPLICATIONS:  There were no complications.  Perioperative antibiotics were given.  INDICATIONS:  The patient is a 41 year old male who suffered a fall on the job.  The patient sustained a closed midshaft clavicle fracture with extreme displacement requiring ORIF.  The patient developed postoperative stiffness in the shoulder joint.  The patient has had physical therapy and has not regained full range of motion.  He is brought to the operating theater having healed his clavicle fracture for clavicle pin removal and also  for exam under anesthesia, manipulation under anesthesia, potential arthroscopic capsular release.  Informed consent obtained.  DESCRIPTION OF PROCEDURE:  After adequate level of anesthesia achieved, the patient was positioned in modified beach-chair position.  Right shoulder exam under anesthesia forward elevation 90, abduction is about 45, external rotation maybe 20, internal rotation 20 degrees with the arm adducted.  We attempted at manipulation and were not able to make any progress with that.  We then went ahead and sterilely prepped and draped the shoulder and arm.  Time-out had been called previously.  We did another time-out once we were ready to cut skin.  We then entered the shoulder posteriorly through the patient's prior incision for the clavicle pin.  We dissected around the pin, which was deep to the subcu using the Therapist, nutritional.  We then placed the medial clavicle ridge over the medial nut and then backed the entire pin construct with 2 nuts on it off the back of the shoulder.  We thoroughly irrigated that wound, closed with interrupted nylon closure.  We then inserted our arthroscopic portals, posterior, anterior, and lateral portals using similar technique.  We placed the scope into the shoulder and identified normal biceps, superior labral junction.  Rotator cuff appeared normal. There was extensive scarring and thickening of the capsule and so we released glenohumeral ligament the capsule inferiorly around the back using basket forceps and the ArthroCare unit.  We were careful around the inferior portion of  the shoulder to protect the axillary nerve.  The articular cartilage showed some early signs of chondromalacia, but not bad.  No deep or significant partial-thickness lesions.  The rotator cuff was normal, was visualized from both anterior-posterior portals. We then placed scope in subacromial space.  Extensive bursitis was noted, which we removed.  We were able  to visualize rotator cuff, which was intact throughout.  There were no signs of any impinging from spurs under the subacromial area.  We then concluded surgery dramatically restoring range of motion up to 160 degrees of forward elevation, abduction 90, external rotation 60, internal rotation at least 90 degrees with the arm abducted.  At this point, we thoroughly irrigated the wounds, closed the wounds with nylon and sterile compressive bandage and then a shoulder sling immobilizer.  The patient tolerated the procedure well.     Almedia Balls. Ranell Patrick, M.D.     SRN/MEDQ  D:  01/19/2012  T:  01/20/2012  Job:  [id number]

## 2012-01-22 ENCOUNTER — Encounter (HOSPITAL_COMMUNITY): Payer: Self-pay | Admitting: Orthopedic Surgery

## 2013-06-09 ENCOUNTER — Encounter (HOSPITAL_COMMUNITY): Payer: Self-pay | Admitting: Emergency Medicine

## 2013-06-09 ENCOUNTER — Emergency Department (HOSPITAL_COMMUNITY): Payer: 59

## 2013-06-09 ENCOUNTER — Emergency Department (HOSPITAL_COMMUNITY)
Admission: EM | Admit: 2013-06-09 | Discharge: 2013-06-09 | Disposition: A | Discharge status: Home / Self Care | Payer: 59 | Attending: Emergency Medicine | Admitting: Emergency Medicine

## 2013-06-09 DIAGNOSIS — F411 Generalized anxiety disorder: Secondary | ICD-10-CM | POA: Insufficient documentation

## 2013-06-09 DIAGNOSIS — K219 Gastro-esophageal reflux disease without esophagitis: Secondary | ICD-10-CM | POA: Insufficient documentation

## 2013-06-09 DIAGNOSIS — Y9289 Other specified places as the place of occurrence of the external cause: Secondary | ICD-10-CM | POA: Insufficient documentation

## 2013-06-09 DIAGNOSIS — S99919A Unspecified injury of unspecified ankle, initial encounter: Principal | ICD-10-CM

## 2013-06-09 DIAGNOSIS — Y9389 Activity, other specified: Secondary | ICD-10-CM | POA: Insufficient documentation

## 2013-06-09 DIAGNOSIS — M25561 Pain in right knee: Secondary | ICD-10-CM

## 2013-06-09 DIAGNOSIS — Y990 Civilian activity done for income or pay: Secondary | ICD-10-CM | POA: Insufficient documentation

## 2013-06-09 DIAGNOSIS — Z87442 Personal history of urinary calculi: Secondary | ICD-10-CM | POA: Insufficient documentation

## 2013-06-09 DIAGNOSIS — X500XXA Overexertion from strenuous movement or load, initial encounter: Secondary | ICD-10-CM | POA: Insufficient documentation

## 2013-06-09 DIAGNOSIS — Z79899 Other long term (current) drug therapy: Secondary | ICD-10-CM | POA: Insufficient documentation

## 2013-06-09 DIAGNOSIS — S99929A Unspecified injury of unspecified foot, initial encounter: Principal | ICD-10-CM

## 2013-06-09 DIAGNOSIS — Z880 Allergy status to penicillin: Secondary | ICD-10-CM | POA: Insufficient documentation

## 2013-06-09 DIAGNOSIS — S8990XA Unspecified injury of unspecified lower leg, initial encounter: Secondary | ICD-10-CM | POA: Insufficient documentation

## 2013-06-09 MED ORDER — NAPROXEN 500 MG PO TABS: 500.0000 mg | ORAL_TABLET | Freq: Two times a day (BID) | ORAL | Status: AC

## 2013-06-09 NOTE — ED Notes (Signed)
Pt states that he was getting out of his car with some gravel and felt his right knee pop. Pt continued through work but during work his pain continued to get worse.

## 2013-06-09 NOTE — Discharge Instructions (Signed)
Please call your doctor for a followup appointment within 24-48 hours. When you talk to your doctor please let them know that you were seen in the emergency department and have them acquire all of your records so that they can discuss the findings with you and formulate a treatment plan to fully care for your new and ongoing problems.  Your xrays are normal - no signs of fracture.  Naprosyn twice daily -   Follow RICE therapy

## 2013-06-09 NOTE — ED Provider Notes (Signed)
CSN: [id number]     Arrival date & time 06/09/13  [id number] History   First MD Initiated Contact with Patient 06/09/13 0444     Chief Complaint  Patient presents with  . Knee Pain   (Consider location/radiation/quality/duration/timing/severity/associated sxs/prior Treatment) HPI Comments: 43 year old male who states that approximately 7:30 last night he stepped out of his car, tried to step up onto a curb at his knee bent in an abnormal and awkward way causing acute onset of pain and a popping sensation. Since that time he has been ambulatory but with difficulty and ongoing pain which is located in the lateral and posterior knee on the right side. There is no numbness, no weakness, no swelling no redness.  Patient is a 43 y.o. male presenting with knee pain. The history is provided by the patient.  Knee Pain Associated symptoms: no back pain and no neck pain     Past Medical History  Diagnosis Date  . Diverticulosis     hx of  . Anxiety   . GERD (gastroesophageal reflux disease)   . Kidney stones     none for 2-3 years   Past Surgical History  Procedure Laterality Date  . Colon surgery      colon resection  . Hemorrhoid surgery    . Orif clavicular fracture  07/27/2011    Procedure: OPEN REDUCTION INTERNAL FIXATION (ORIF) CLAVICULAR FRACTURE;  Surgeon: Verlee RossettiSteven R Norris, MD;  [facility];  Service: Orthopedics;  Laterality: Right;  ORIF Clavicular Fracture  . Shoulder arthroscopy  01/19/2012    Procedure: ARTHROSCOPY SHOULDER;  Surgeon: Verlee RossettiSteven R Norris, MD;  [facility];  Service: Orthopedics;  Laterality: Right;  RIGHT SHOULDER ARTHROSCOPY WITH HARDWARE REMOVAL, CLAVICLE PIN/CAPSULAR RELEASE   History reviewed. No pertinent family history. History  Substance Use Topics  . Smoking status: Never Smoker   . Smokeless tobacco: Not on file  . Alcohol Use: No    Review of Systems  Gastrointestinal: Negative for nausea and vomiting.  Musculoskeletal: Negative for back pain, joint  swelling and neck pain.  Neurological: Negative for weakness and numbness.    Allergies  Penicillins and Tape  Home Medications   Current Outpatient Rx  Name  Route  Sig  Dispense  Refill  . cetirizine (ZYRTEC) 10 MG tablet   Oral   Take 10 mg by mouth daily.         Marland Kitchen. escitalopram (LEXAPRO) 10 MG tablet   Oral   Take 10 mg by mouth daily.         Marland Kitchen. guaiFENesin (MUCINEX) 600 MG 12 hr tablet   Oral   Take 600 mg by mouth 2 (two) times daily as needed.         . pantoprazole (PROTONIX) 40 MG tablet   Oral   Take 40-80 mg by mouth daily as needed. For acid reflux         . naproxen (NAPROSYN) 500 MG tablet   Oral   Take 1 tablet (500 mg total) by mouth 2 (two) times daily with a meal.   30 tablet   0    BP 146/98  Pulse 95  Temp(Src) 98.2 F (36.8 C) (Oral)  Resp 16  SpO2 100% Physical Exam  Nursing note and vitals reviewed. Constitutional: He appears well-developed and well-nourished. No distress.  HENT:  Head: Normocephalic and atraumatic.  Eyes: Conjunctivae are normal. No scleral icterus.  Cardiovascular: Normal rate, regular rhythm and intact distal pulses.   Pulmonary/Chest: Effort normal  and breath sounds normal.  Musculoskeletal: He exhibits tenderness ( lateral and posterior mild ttp, no effusion, no redness, full ROM of the R knee). He exhibits no edema.  Neurological: He is alert.  Skin: Skin is warm and dry. No rash noted. He is not diaphoretic.    ED Course  Procedures (including critical care time) Labs Review Labs Reviewed - No data to display Imaging Review Dg Knee Complete 4 Views Right  06/09/2013   CLINICAL DATA:  Trauma, knee pain.  EXAM: RIGHT KNEE - COMPLETE 4+ VIEW  COMPARISON:  None available for comparison at time of study interpretation.  FINDINGS: No acute fracture deformity or dislocation. Joint space intact without erosions. No destructive bony lesions. Small suprapatellar joint effusion.  IMPRESSION: Small suprapatellar  joint effusion without fracture deformity or dislocation.   Electronically Signed   By: Awilda Metro   On: 06/09/2013 05:29    EKG Interpretation   None       MDM   1. Knee pain, right    The patient has good range of motion, doubt fracture, no effusion, imaging, anti-inflammatories, Rice therapy, home.  xrays neg for frx,  RICE tehrapy  Meds given in ED:  Medications - No data to display  New Prescriptions   NAPROXEN (NAPROSYN) 500 MG TABLET    Take 1 tablet (500 mg total) by mouth 2 (two) times daily with a meal.      Vida Roller, MD 06/09/13 [phone]

## 2013-06-09 NOTE — ED Notes (Signed)
Pt states he got out of his car in a gravel parking lot and when the gravel transitioned to cement (6" difference) he felt his knee pop.  Pt was able to work for [employer] for a couple of hours before the pain became unbearable.  Pt states "I can bear a little weight on my right leg but the pain is a 10/10 with weight".

## 2017-07-28 ENCOUNTER — Encounter (HOSPITAL_COMMUNITY): Payer: Self-pay | Admitting: *Deleted

## 2017-07-28 ENCOUNTER — Other Ambulatory Visit: Payer: Self-pay

## 2017-07-28 ENCOUNTER — Emergency Department (HOSPITAL_COMMUNITY): Payer: 59

## 2017-07-28 ENCOUNTER — Emergency Department (HOSPITAL_COMMUNITY)
Admission: EM | Admit: 2017-07-28 | Discharge: 2017-07-28 | Disposition: A | Discharge status: Home / Self Care | Payer: 59 | Attending: Emergency Medicine | Admitting: Emergency Medicine

## 2017-07-28 DIAGNOSIS — S0990XA Unspecified injury of head, initial encounter: Secondary | ICD-10-CM

## 2017-07-28 DIAGNOSIS — W01198A Fall on same level from slipping, tripping and stumbling with subsequent striking against other object, initial encounter: Secondary | ICD-10-CM | POA: Insufficient documentation

## 2017-07-28 DIAGNOSIS — Y999 Unspecified external cause status: Secondary | ICD-10-CM | POA: Diagnosis not present

## 2017-07-28 DIAGNOSIS — S0101XA Laceration without foreign body of scalp, initial encounter: Secondary | ICD-10-CM

## 2017-07-28 DIAGNOSIS — Z79899 Other long term (current) drug therapy: Secondary | ICD-10-CM | POA: Insufficient documentation

## 2017-07-28 DIAGNOSIS — Y939 Activity, unspecified: Secondary | ICD-10-CM | POA: Diagnosis not present

## 2017-07-28 DIAGNOSIS — Y92008 Other place in unspecified non-institutional (private) residence as the place of occurrence of the external cause: Secondary | ICD-10-CM | POA: Diagnosis not present

## 2017-07-28 DIAGNOSIS — Z23 Encounter for immunization: Secondary | ICD-10-CM | POA: Insufficient documentation

## 2017-07-28 MED ORDER — TETANUS-DIPHTH-ACELL PERTUSSIS 5-2.5-18.5 LF-MCG/0.5 IM SUSP
0.5000 mL | Freq: Once | INTRAMUSCULAR | Status: AC
  Administered 2017-07-28: 0.5 mL via INTRAMUSCULAR
  Filled 2017-07-28: qty 0.5

## 2017-07-28 NOTE — ED Triage Notes (Signed)
Pt stumbled in his garage and hit his head on a shelf; laceration noted to top of head. Bleeding controlled at this time. Denies LOC; c/o nausea, feeling sleepy,  and increased headache.

## 2017-07-28 NOTE — Discharge Instructions (Signed)
Return here as needed. Have staples out in 7 days. Keep area clean and dry.

## 2017-08-04 NOTE — ED Provider Notes (Signed)
[facility] Note   CSN: [id number] Arrival date & time: 07/28/17  0129     History   Chief Complaint Chief Complaint  Patient presents with  . Laceration    HPI Stephens ShireMark Prout is a 47 y.o. male.  HPI Patient presents to the emergency department with injuries following a fall.  The patient states he tripped in the garage striking his head against a shelf.  The patient has a small laceration to the top of his scalp.  Patient states he did not lose consciousness and did not have any other symptoms following the fall.  The patient has no other injuries at this time.  The patient denies chest pain, shortness of breath, headache,blurred vision, neck pain, fever, cough, weakness, numbness, dizziness, anorexia, edema, abdominal pain, nausea, vomiting, diarrhea, rash, back pain, dysuria, hematemesis, bloody stool, near syncope, or syncope. Past Medical History:  Diagnosis Date  . Anxiety   . Diverticulosis    hx of  . GERD (gastroesophageal reflux disease)   . Kidney stones    none for 2-3 years    There are no active problems to display for this patient.   Past Surgical History:  Procedure Laterality Date  . COLON SURGERY     colon resection  . HEMORRHOID SURGERY    . ORIF CLAVICULAR FRACTURE  07/27/2011   Procedure: OPEN REDUCTION INTERNAL FIXATION (ORIF) CLAVICULAR FRACTURE;  Surgeon: Verlee RossettiSteven R Norris, MD;  [facility];  Service: Orthopedics;  Laterality: Right;  ORIF Clavicular Fracture  . SHOULDER ARTHROSCOPY  01/19/2012   Procedure: ARTHROSCOPY SHOULDER;  Surgeon: Verlee RossettiSteven R Norris, MD;  [facility];  Service: Orthopedics;  Laterality: Right;  RIGHT SHOULDER ARTHROSCOPY WITH HARDWARE REMOVAL, CLAVICLE PIN/CAPSULAR RELEASE        Home Medications    Prior to Admission medications   Medication Sig Start Date End Date Taking? Authorizing Provider  cetirizine (ZYRTEC) 10 MG tablet Take 10 mg by mouth daily.    [provider]  escitalopram (LEXAPRO) 10 MG tablet Take 10 mg by mouth daily.    [provider]  guaiFENesin (MUCINEX) 600 MG 12 hr tablet Take 600 mg by mouth 2 (two) times daily as needed.    [provider]  naproxen (NAPROSYN) 500 MG tablet Take 1 tablet (500 mg total) by mouth 2 (two) times daily with a meal. 06/09/13   Eber HongMiller, Brian, MD  pantoprazole (PROTONIX) 40 MG tablet Take 40-80 mg by mouth daily as needed. For acid reflux    [provider]    Family History No family history on file.  Social History Social History   Tobacco Use  . Smoking status: Never Smoker  . Smokeless tobacco: Never Used  Substance Use Topics  . Alcohol use: No  . Drug use: No     Allergies   Penicillins and Tape   Review of Systems Review of Systems All other systems negative except as documented in the HPI. All pertinent positives and negatives as reviewed in the HPI.  Physical Exam Updated Vital Signs BP (!) 150/114   Pulse (!) 56   Temp 97.9 F (36.6 C)   Resp 14   Ht 6\' 1"  (1.854 m)   Wt 108.9 kg (240 lb)   SpO2 95%   BMI 31.66 kg/m   Physical Exam  Constitutional: He is oriented to person, place, and time. He appears well-developed and well-nourished. No distress.  HENT:  Head: Normocephalic.  Eyes: Pupils are equal, round, and reactive to light.  Pulmonary/Chest: Effort normal.  Neurological: He is alert and oriented to person, place, and time.  Skin: Skin is warm and dry.  Psychiatric: He has a normal mood and affect.  Nursing note and vitals reviewed.    ED Treatments / Results  Labs (all labs ordered are listed, but only abnormal results are displayed) Labs Reviewed - No data to display  EKG None  Radiology No results found.  Procedures Procedures (including critical care time)  Medications Ordered in ED Medications  Tdap (BOOSTRIX) injection 0.5 mL (0.5 mLs Intramuscular Given 07/28/17 0856)     Initial Impression /  Assessment and Plan / ED Course  I have reviewed the triage vital signs and the nursing notes.  Pertinent labs & imaging results that were available during my care of the patient were reviewed by me and considered in my medical decision making (see chart for details).     LACERATION REPAIR Performed by: Carlyle Dolly Authorized by: Jamesetta Orleans Lashica Hannay Consent: Verbal consent obtained. Risks and benefits: risks, benefits and alternatives were discussed Consent given by: patient Patient identity confirmed: provided demographic data Prepped and Draped in normal sterile fashion Wound explored  Laceration Location: Scalp  Laceration Length: 2 cm  No Foreign Bodies seen or palpated  Anesthesia: local infiltration  Local anesthetic: Not applicable  Anesthetic total: Not applicable   Irrigation method: syringe Amount of cleaning: standard  Skin closure: Staples  Number of sutures: 3  Technique: Staples  Patient tolerance: Patient tolerated the procedure well with no immediate complications.  Patient is advised to return for any signs of infection or worsening in his condition.  Patient is advised to have the staples out in 7 days.  Patient agrees the plan and all questions were answered patient has no neurological deficits noted on exam.  Head injury precautions were given to the patient and his wife. Final Clinical Impressions(s) / ED Diagnoses   Final diagnoses:  Injury of head, initial encounter  Scalp laceration, initial encounter    ED Discharge Orders    None       Charlestine Night, PA-C 08/04/17 0107    Geoffery Lyons, MD 08/07/17 [phone]

## 2017-08-06 ENCOUNTER — Emergency Department (HOSPITAL_COMMUNITY)
Admission: EM | Admit: 2017-08-06 | Discharge: 2017-08-06 | Disposition: A | Discharge status: Home / Self Care | Payer: 59 | Attending: Emergency Medicine | Admitting: Emergency Medicine

## 2017-08-06 ENCOUNTER — Encounter (HOSPITAL_COMMUNITY): Payer: Self-pay | Admitting: Emergency Medicine

## 2017-08-06 DIAGNOSIS — Z4802 Encounter for removal of sutures: Secondary | ICD-10-CM

## 2017-08-06 DIAGNOSIS — Z79899 Other long term (current) drug therapy: Secondary | ICD-10-CM | POA: Insufficient documentation

## 2017-08-06 DIAGNOSIS — S0191XD Laceration without foreign body of unspecified part of head, subsequent encounter: Secondary | ICD-10-CM | POA: Diagnosis not present

## 2017-08-06 DIAGNOSIS — Y33XXXD Other specified events, undetermined intent, subsequent encounter: Secondary | ICD-10-CM | POA: Insufficient documentation

## 2017-08-06 NOTE — Discharge Instructions (Addendum)
Please return to the emergency department if you develop redness, pain, swelling around the site where the staples were placed or fever and chills.  Continue to keep the area clean with warm water and shampoo or soap.

## 2017-08-06 NOTE — ED Notes (Signed)
Pt staples removed per Mia, PA- pt discharged-- unable to sign due to no hallway computer.

## 2017-08-06 NOTE — ED Triage Notes (Signed)
Pt to ER for staple removal. Had placed for laceration to head after a fall. Site looks clean, non-infected.

## 2017-08-06 NOTE — ED Provider Notes (Signed)
[facility] EMERGENCY DEPARTMENT Provider Note   CSN: [id number] Arrival date & time: 08/06/17  4098     History   Chief Complaint Chief Complaint  Patient presents with  . Suture / Staple Removal    HPI George Miles is a 47 y.o. male who presents to the emergency department for staple removal.  The patient had 3 staples placed in the ED on 07/28/17 after he sustained a fall and hit his head against a shelf. Tdap was updated.   He denies fever, chills, or redness, pain, and swelling around the site of the laceration.  He is not immunocompromised.  The history is provided by the patient. No language interpreter was used.  Suture / Staple Removal  This is a new problem. The current episode started more than 1 week ago. The problem occurs constantly. The problem has been gradually improving. Pertinent negatives include no chest pain, no abdominal pain, no headaches and no shortness of breath. Nothing aggravates the symptoms. Nothing relieves the symptoms. He has tried nothing for the symptoms.    Past Medical History:  Diagnosis Date  . Anxiety   . Diverticulosis    hx of  . GERD (gastroesophageal reflux disease)   . Kidney stones    none for 2-3 years    There are no active problems to display for this patient.   Past Surgical History:  Procedure Laterality Date  . COLON SURGERY     colon resection  . HEMORRHOID SURGERY    . ORIF CLAVICULAR FRACTURE  07/27/2011   Procedure: OPEN REDUCTION INTERNAL FIXATION (ORIF) CLAVICULAR FRACTURE;  Surgeon: Verlee Rossetti, MD;  [facility];  Service: Orthopedics;  Laterality: Right;  ORIF Clavicular Fracture  . SHOULDER ARTHROSCOPY  01/19/2012   Procedure: ARTHROSCOPY SHOULDER;  Surgeon: Verlee Rossetti, MD;  Location: [facility] OR;  Service: Orthopedics;  Laterality: Right;  RIGHT SHOULDER ARTHROSCOPY WITH HARDWARE REMOVAL, CLAVICLE PIN/CAPSULAR RELEASE        Home Medications    Prior to Admission medications    Medication Sig Start Date End Date Taking? Authorizing Provider  cetirizine (ZYRTEC) 10 MG tablet Take 10 mg by mouth daily.    [provider]  escitalopram (LEXAPRO) 10 MG tablet Take 10 mg by mouth daily.    [provider]  guaiFENesin (MUCINEX) 600 MG 12 hr tablet Take 600 mg by mouth 2 (two) times daily as needed.    [provider]  naproxen (NAPROSYN) 500 MG tablet Take 1 tablet (500 mg total) by mouth 2 (two) times daily with a meal. 06/09/13   Eber Hong, MD  pantoprazole (PROTONIX) 40 MG tablet Take 40-80 mg by mouth daily as needed. For acid reflux    [provider]    Family History History reviewed. No pertinent family history.  Social History Social History   Tobacco Use  . Smoking status: Never Smoker  . Smokeless tobacco: Never Used  Substance Use Topics  . Alcohol use: No  . Drug use: No     Allergies   Penicillins and Tape   Review of Systems Review of Systems  Constitutional: Negative for chills and fever.  Respiratory: Negative for shortness of breath.   Cardiovascular: Negative for chest pain.  Gastrointestinal: Negative for abdominal pain.  Skin: Positive for wound.  Allergic/Immunologic: Negative for immunocompromised state.  Neurological: Negative for headaches.   Physical Exam Updated Vital Signs BP (!) 138/97 (BP Location: Right Arm)   Pulse 80  Temp 97.6 F (36.4 C) (Oral)   Resp 18   SpO2 96%   Physical Exam  Constitutional: He appears well-developed. No distress.  HENT:  Head: Normocephalic.  Right Ear: External ear normal.  Left Ear: External ear normal.  Nose: Nose normal.  Mouth/Throat: Oropharynx is clear and moist.  There is a well-healing laceration with 3 staples in place to the skin overlying the left frontal scalp.  No surrounding erythema, edema, or warmth.  Eyes: Conjunctivae are normal.  Neck: Neck supple.  Cardiovascular: Normal rate and regular rhythm.  No murmur  heard. Pulmonary/Chest: Effort normal.  Abdominal: Soft. He exhibits no distension.  Neurological: He is alert.  Skin: Skin is warm and dry.  Psychiatric: His behavior is normal.  Nursing note and vitals reviewed.  ED Treatments / Results  Labs (all labs ordered are listed, but only abnormal results are displayed) Labs Reviewed - No data to display  EKG None  Radiology No results found.  Procedures .Suture Removal Date/Time: 08/06/2017 8:44 AM Performed by: Barkley BoardsMcDonald, Kiara Keep A, PA-C Authorized by: Barkley BoardsMcDonald, Abbygail [place]hoite A, PA-C   Consent:    Consent obtained:  Verbal   Consent given by:  Patient Location:    Location:  Head/neck   Head/neck location:  Scalp Procedure details:    Wound appearance:  No signs of infection   Number of staples removed:  3 Post-procedure details:    Post-removal:  No dressing applied   Patient tolerance of procedure:  Tolerated well, no immediate complications   (including critical care time)  Medications Ordered in ED Medications - No data to display   Initial Impression / Assessment and Plan / ED Course  I have reviewed the triage vital signs and the nursing notes.  Pertinent labs & imaging results that were available during my care of the patient were reviewed by me and considered in my medical decision making (see chart for details).     Staple removal   Pt to ER for staple removal and wound check as above. Procedure tolerated well. Vitals normal, no signs of infection. Scar minimization & return precautions given at dc.   Final Clinical Impressions(s) / ED Diagnoses   Final diagnoses:  Encounter for removal of staples    ED Discharge Orders    None       Barkley BoardsMcDonald, Dylann Layne A, PA-C 08/06/17 0845    Eber HongMiller, Brian, MD 08/06/17 2141

## 2018-11-01 IMAGING — CT CT HEAD W/O CM
4 series · 15 of 47 positions shown, 17 images · non-contrast
Comparison: None.

CLINICAL DATA: Hit head on shelf, with laceration at the top of the
head. Headache and lethargy. Nausea.

EXAM:
CT HEAD WITHOUT CONTRAST
TECHNIQUE: Contiguous axial images were obtained from the base of the skull
through the vertex without intravenous contrast.

[Series 3: head wo · axial · 0.44mm/px · z∈[-124,+6]mm · 7 of 36 slices shown, 9 images]
[im 5/36  brain]
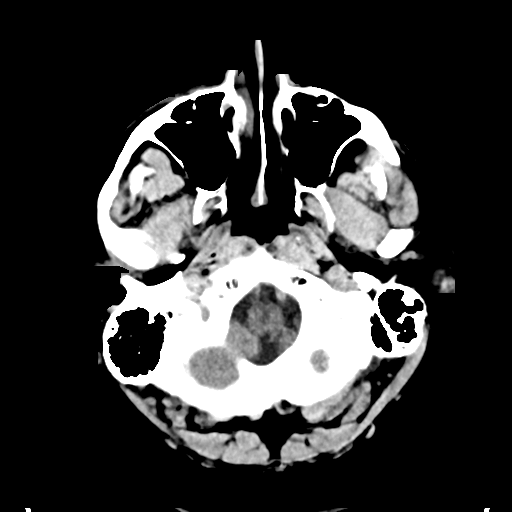
[im 5/36  bone]
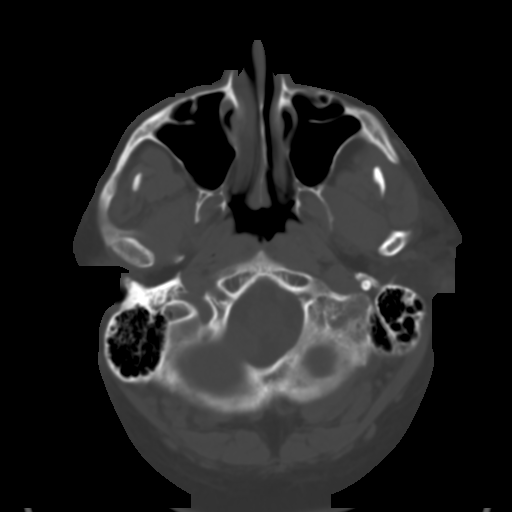
[im 9/36  brain]
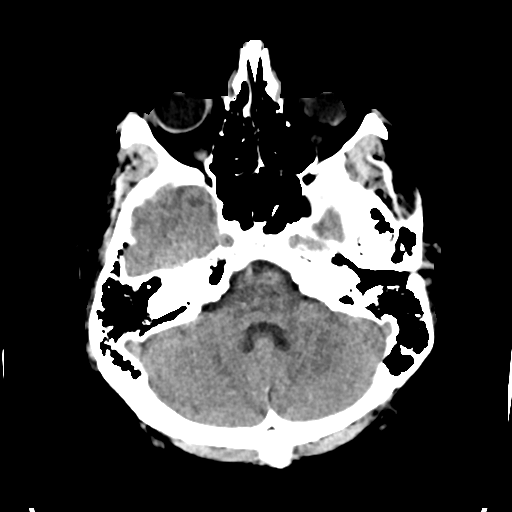
[im 14/36  brain]
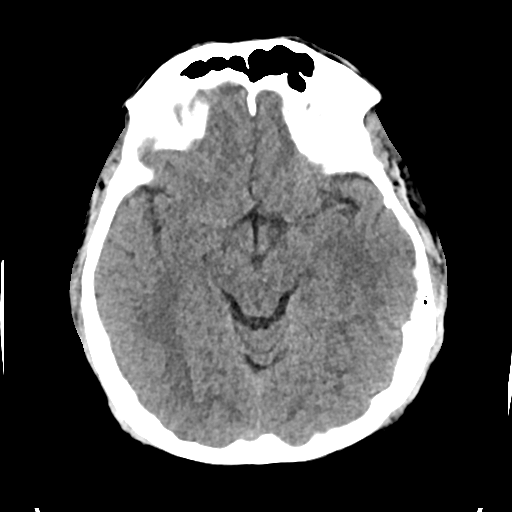
[im 18/36  brain]
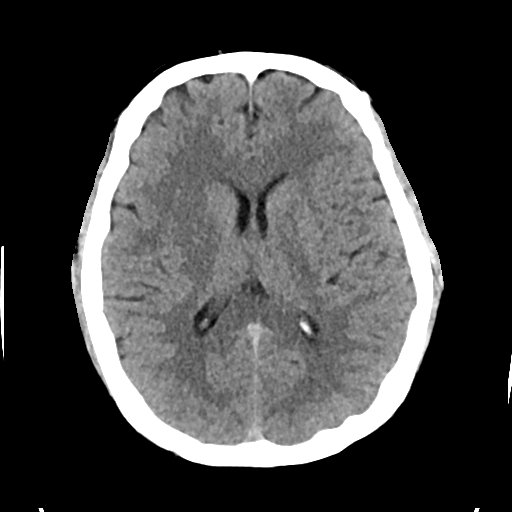
[im 22/36  brain]
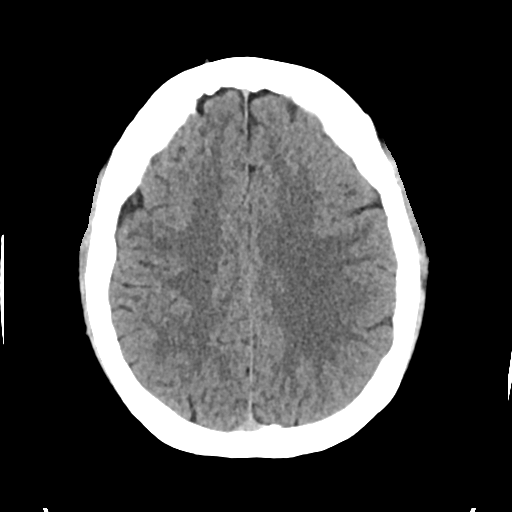
[im 22/36  bone]
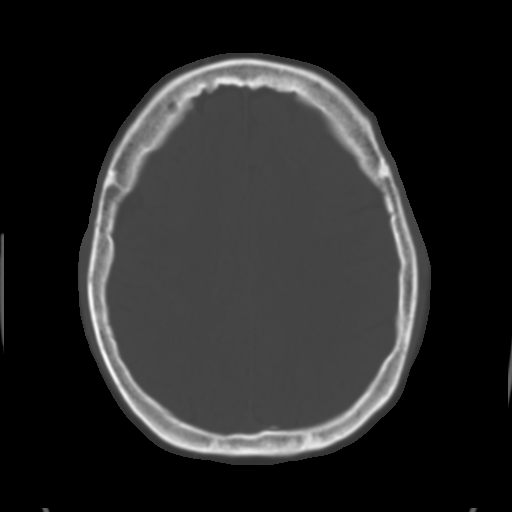
[im 27/36  brain]
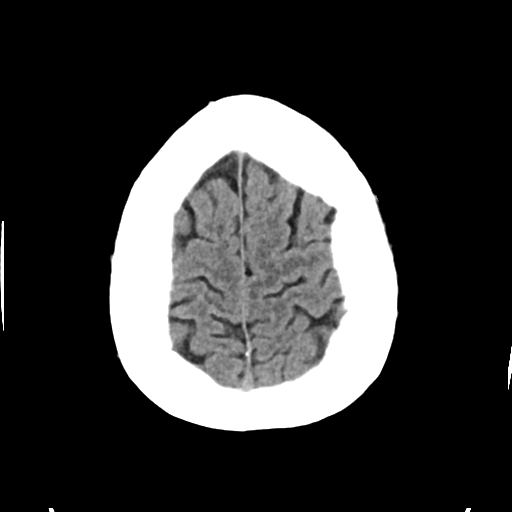
[im 31/36  brain]
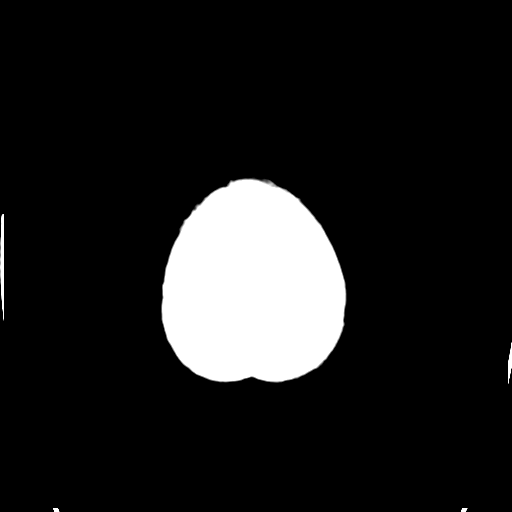

[Series 4: head bone · axial · 0.44mm/px · z∈[-128,-110]mm · 2 of 90 slices shown]
[im 9/90  bone]
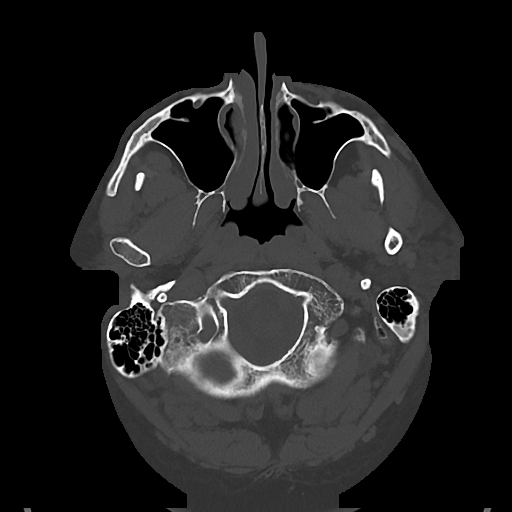
[im 18/90  bone]
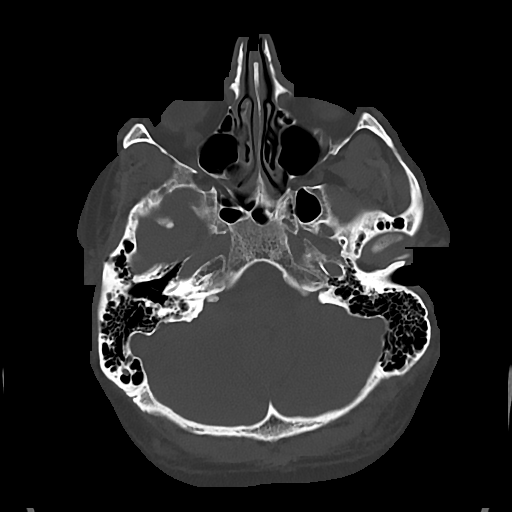

[Series 5: cor soft · coronal · 0.35mm/px · 3 of 82 slices shown]
[im 28/82  brain]
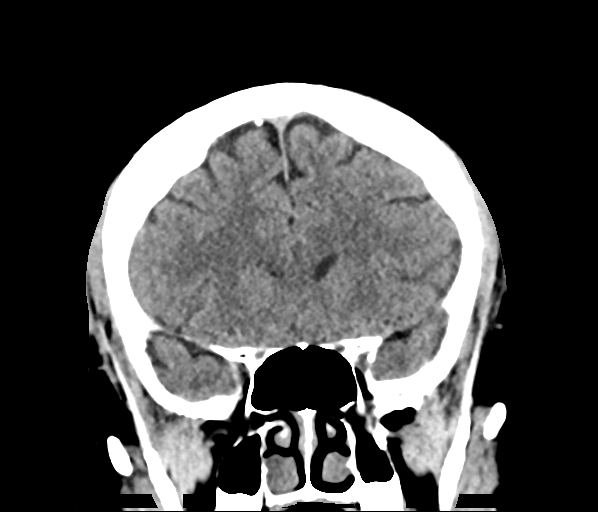
[im 37/82  brain]
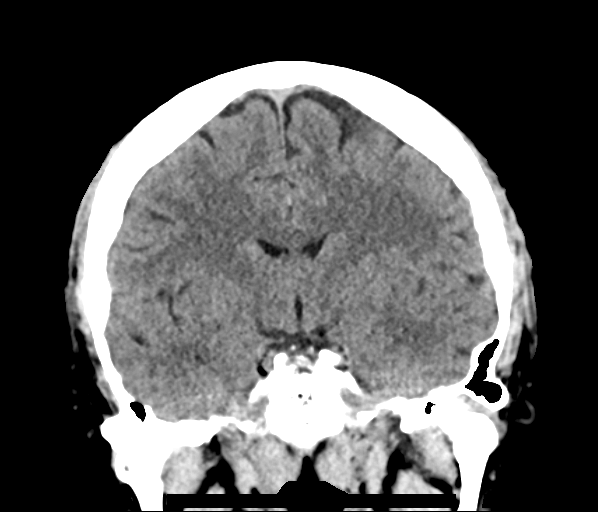
[im 46/82  brain]
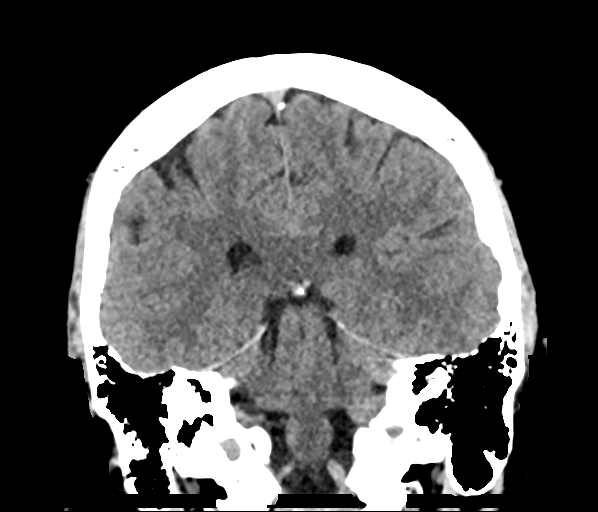

[Series 6: sag soft · sagittal · 0.35mm/px · 3 of 67 slices shown]
[im 23/67  brain]
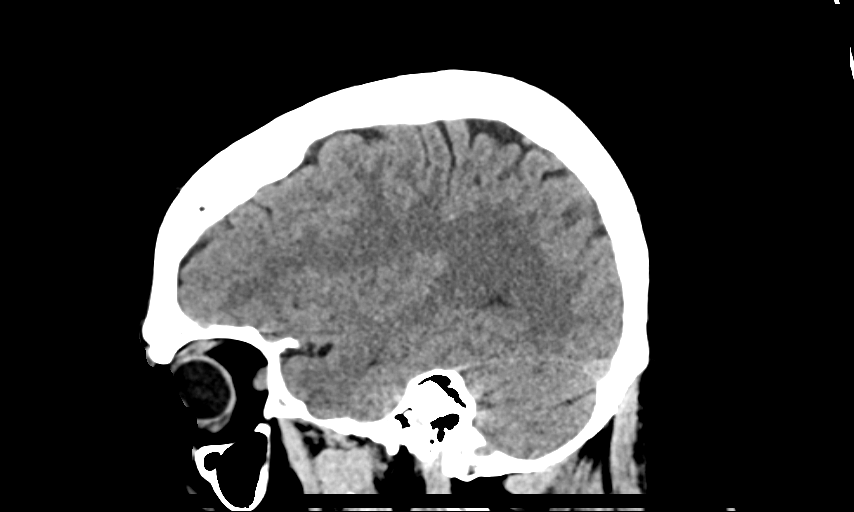
[im 34/67  brain]
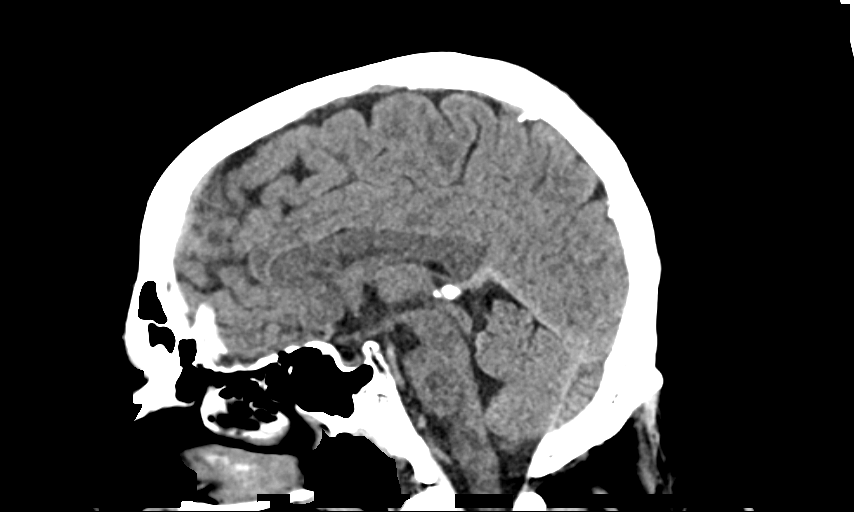
[im 45/67  brain]
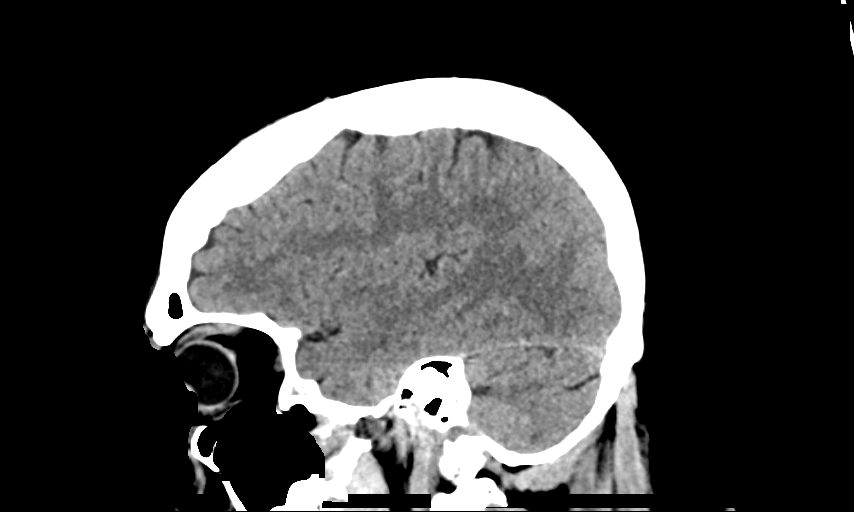

[15 of 47 positions shown; findings below may reference images not displayed]

FINDINGS: Brain: No evidence of acute infarction, hemorrhage, hydrocephalus,
extra-axial collection or mass lesion/mass effect.

The posterior fossa, including the cerebellum, brainstem and fourth
ventricle, is within normal limits. The third and lateral
ventricles, and basal ganglia are unremarkable in appearance. The
cerebral hemispheres are symmetric in appearance, with normal
gray-white differentiation. No mass effect or midline shift is seen.

Vascular: No hyperdense vessel or unexpected calcification.

Skull: There is no evidence of fracture; visualized osseous
structures are unremarkable in appearance.

Sinuses/Orbits: The orbits are within normal limits. The paranasal
sinuses and mastoid air cells are well-aerated.

Other: The known soft tissue laceration is not well characterized on
CT.
IMPRESSION: No evidence of traumatic intracranial injury or fracture.
